# Patient Record
Sex: Female | Born: 1960 | Race: Black or African American | Hispanic: No | Marital: Married | Smoking: Former smoker
Health system: Southern US, Community
[De-identification: ages and names within clinical notes are randomized; demographics above are authoritative.]

## PROBLEM LIST (undated history)

## (undated) DIAGNOSIS — J45909 Unspecified asthma, uncomplicated: Secondary | ICD-10-CM

## (undated) DIAGNOSIS — M5010 Cervical disc disorder with radiculopathy, unspecified cervical region: Secondary | ICD-10-CM

## (undated) HISTORY — PX: ABDOMINAL HYSTERECTOMY: SHX81

## (undated) HISTORY — PX: APPENDECTOMY: SHX54

---

## 2010-12-29 ENCOUNTER — Emergency Department (HOSPITAL_BASED_OUTPATIENT_CLINIC_OR_DEPARTMENT_OTHER)
Admission: EM | Admit: 2010-12-29 | Discharge: 2010-12-30 | Disposition: A | Discharge status: Home / Self Care | Payer: Worker's Compensation | Attending: Emergency Medicine | Admitting: Emergency Medicine

## 2010-12-29 DIAGNOSIS — J45909 Unspecified asthma, uncomplicated: Secondary | ICD-10-CM | POA: Insufficient documentation

## 2010-12-29 DIAGNOSIS — M79609 Pain in unspecified limb: Secondary | ICD-10-CM | POA: Insufficient documentation

## 2015-07-17 ENCOUNTER — Emergency Department (HOSPITAL_BASED_OUTPATIENT_CLINIC_OR_DEPARTMENT_OTHER)
Admission: EM | Admit: 2015-07-17 | Discharge: 2015-07-18 | Disposition: A | Discharge status: Home / Self Care | Payer: Worker's Compensation | Attending: Emergency Medicine | Admitting: Emergency Medicine

## 2015-07-17 ENCOUNTER — Encounter (HOSPITAL_BASED_OUTPATIENT_CLINIC_OR_DEPARTMENT_OTHER): Payer: Self-pay | Admitting: Emergency Medicine

## 2015-07-17 ENCOUNTER — Emergency Department (HOSPITAL_BASED_OUTPATIENT_CLINIC_OR_DEPARTMENT_OTHER): Payer: Worker's Compensation

## 2015-07-17 DIAGNOSIS — Z87891 Personal history of nicotine dependence: Secondary | ICD-10-CM | POA: Insufficient documentation

## 2015-07-17 DIAGNOSIS — R0789 Other chest pain: Secondary | ICD-10-CM | POA: Insufficient documentation

## 2015-07-17 DIAGNOSIS — Z880 Allergy status to penicillin: Secondary | ICD-10-CM | POA: Insufficient documentation

## 2015-07-17 DIAGNOSIS — R0781 Pleurodynia: Secondary | ICD-10-CM

## 2015-07-17 DIAGNOSIS — J45909 Unspecified asthma, uncomplicated: Secondary | ICD-10-CM | POA: Insufficient documentation

## 2015-07-17 HISTORY — DX: Unspecified asthma, uncomplicated: J45.909

## 2015-07-17 NOTE — ED Notes (Signed)
Patient transported to X-ray 

## 2015-07-17 NOTE — ED Notes (Signed)
Pain and tenderness under left breast since 2am.  Worse with movement.  Sts she has been coughing for a month. More pain with the cough today.

## 2015-07-18 ENCOUNTER — Encounter (HOSPITAL_BASED_OUTPATIENT_CLINIC_OR_DEPARTMENT_OTHER): Payer: Self-pay | Admitting: Emergency Medicine

## 2015-07-18 ENCOUNTER — Emergency Department (HOSPITAL_BASED_OUTPATIENT_CLINIC_OR_DEPARTMENT_OTHER)
Admission: EM | Admit: 2015-07-18 | Discharge: 2015-07-18 | Disposition: A | Discharge status: Home / Self Care | Payer: Worker's Compensation | Attending: Emergency Medicine | Admitting: Emergency Medicine

## 2015-07-18 DIAGNOSIS — R6883 Chills (without fever): Secondary | ICD-10-CM | POA: Insufficient documentation

## 2015-07-18 DIAGNOSIS — Z880 Allergy status to penicillin: Secondary | ICD-10-CM | POA: Insufficient documentation

## 2015-07-18 DIAGNOSIS — R112 Nausea with vomiting, unspecified: Secondary | ICD-10-CM | POA: Insufficient documentation

## 2015-07-18 DIAGNOSIS — J45901 Unspecified asthma with (acute) exacerbation: Secondary | ICD-10-CM | POA: Insufficient documentation

## 2015-07-18 DIAGNOSIS — R0789 Other chest pain: Secondary | ICD-10-CM

## 2015-07-18 LAB — CBC WITH DIFFERENTIAL/PLATELET
Basophils Absolute: 0 10*3/uL (ref 0.0–0.1)
Basophils Relative: 0 %
EOS ABS: 0 10*3/uL (ref 0.0–0.7)
EOS PCT: 0 %
HCT: 40.3 % (ref 36.0–46.0)
Hemoglobin: 12.5 g/dL (ref 12.0–15.0)
LYMPHS ABS: 1.7 10*3/uL (ref 0.7–4.0)
LYMPHS PCT: 18 %
MCH: 22.8 pg — AB (ref 26.0–34.0)
MCHC: 31 g/dL (ref 30.0–36.0)
MCV: 73.4 fL — AB (ref 78.0–100.0)
MONO ABS: 0.3 10*3/uL (ref 0.1–1.0)
Monocytes Relative: 3 %
Neutro Abs: 7 10*3/uL (ref 1.7–7.7)
Neutrophils Relative %: 79 %
PLATELETS: 333 10*3/uL (ref 150–400)
RBC: 5.49 MIL/uL — ABNORMAL HIGH (ref 3.87–5.11)
RDW: 15.7 % — AB (ref 11.5–15.5)
WBC: 9 10*3/uL (ref 4.0–10.5)

## 2015-07-18 LAB — D-DIMER, QUANTITATIVE: D-Dimer, Quant: 0.28 ug/mL-FEU (ref 0.00–0.50)

## 2015-07-18 LAB — BRAIN NATRIURETIC PEPTIDE: B NATRIURETIC PEPTIDE 5: 35.2 pg/mL (ref 0.0–100.0)

## 2015-07-18 MED ORDER — METHOCARBAMOL 500 MG PO TABS: 1000.0000 mg | ORAL_TABLET | Freq: Three times a day (TID) | ORAL | Status: DC | PRN

## 2015-07-18 MED ORDER — ONDANSETRON 4 MG PO TBDP
4.0000 mg | ORAL_TABLET | Freq: Once | ORAL | Status: AC | PRN
  Administered 2015-07-18: 4 mg via ORAL
  Filled 2015-07-18: qty 1

## 2015-07-18 MED ORDER — KETOROLAC TROMETHAMINE 30 MG/ML IJ SOLN
30.0000 mg | Freq: Once | INTRAMUSCULAR | Status: AC
  Administered 2015-07-18: 30 mg via INTRAVENOUS
  Filled 2015-07-18: qty 1

## 2015-07-18 MED ORDER — HYDROCODONE-ACETAMINOPHEN 5-325 MG PO TABS
1.0000 | ORAL_TABLET | Freq: Once | ORAL | Status: AC
  Administered 2015-07-18: 1 via ORAL
  Filled 2015-07-18: qty 1

## 2015-07-18 MED ORDER — IBUPROFEN 600 MG PO TABS: 600.0000 mg | ORAL_TABLET | Freq: Four times a day (QID) | ORAL | Status: DC | PRN

## 2015-07-18 MED ORDER — HYDROCODONE-ACETAMINOPHEN 5-325 MG PO TABS: 1.0000 | ORAL_TABLET | Freq: Four times a day (QID) | ORAL | Status: DC | PRN

## 2015-07-18 MED ORDER — TRAMADOL HCL 50 MG PO TABS: 50.0000 mg | ORAL_TABLET | Freq: Four times a day (QID) | ORAL | Status: DC | PRN

## 2015-07-18 NOTE — ED Notes (Signed)
Patient was here earlier for Pain and tenderness under left breast since 2am. The patient states that she is back because it is now up under her rib. Patient states that she is now nauseated and throwing up with cold chills

## 2015-07-18 NOTE — ED Provider Notes (Signed)
CSN: [id number]     Arrival date & time 07/18/15  1905 History  By signing my name below, I, Stephanie Adams, attest that this documentation has been prepared under the direction and in the presence of Loren Racer, MD. Electronically Signed: Budd Adams, ED Scribe. 07/18/2015. 9:01 PM.    Chief Complaint  Patient presents with  . Breast Pain   The history is provided by the patient and the spouse. No language interpreter was used.   HPI Comments: Stephanie Adams is a 55 y.o. female former smoker with a PMHx of asthma who presents to the Emergency Department complaining of constant, sharp pain under the left breast onset yesterday morning at around 3 AM. Pt states she was seen in the ED that morning, at which time she was prescribed Vicodin. She has returned due to the medication causing her n/v and chills, as well as the pain now having moved underneath the rib. She reports associated SOB and rhinorrhea. Per husband, pt has a dry cough starting yesterday. Pt denies swelling and pain in the legs, recent travel, and sore throat.   Past Medical History  Diagnosis Date  . Asthma    Past Surgical History  Procedure Laterality Date  . Appendectomy    . Abdominal hysterectomy    . Cesarean section     History reviewed. No pertinent family history. Social History  Substance Use Topics  . Smoking status: Former Games developer  . Smokeless tobacco: None  . Alcohol Use: No   OB History    No data available     Review of Systems  Constitutional: Positive for chills. Negative for fever.  HENT: Negative for sore throat.   Respiratory: Positive for cough. Negative for shortness of breath and wheezing.   Cardiovascular: Positive for chest pain. Negative for leg swelling.  Gastrointestinal: Positive for nausea and vomiting. Negative for abdominal pain.  Musculoskeletal: Negative for myalgias, back pain and neck pain.  Skin: Negative for rash and wound.  Neurological: Negative for dizziness,  weakness, light-headedness, numbness and headaches.  All other systems reviewed and are negative.   Allergies  Penicillins  Home Medications   Prior to Admission medications   Medication Sig Start Date End Date Taking? Authorizing Provider  HYDROcodone-acetaminophen (NORCO/VICODIN) 5-325 MG tablet Take 1-2 tablets by mouth every 6 (six) hours as needed (for rib pain). 07/18/15  Yes John Molpus, MD  ibuprofen (ADVIL,MOTRIN) 600 MG tablet Take 1 tablet (600 mg total) by mouth every 6 (six) hours as needed. 07/18/15   Loren Racer, MD  methocarbamol (ROBAXIN) 500 MG tablet Take 2 tablets (1,000 mg total) by mouth every 8 (eight) hours as needed for muscle spasms. 07/18/15   Loren Racer, MD  traMADol (ULTRAM) 50 MG tablet Take 1 tablet (50 mg total) by mouth every 6 (six) hours as needed. 07/18/15   Loren Racer, MD   BP 98/63 mmHg  Pulse 67  Temp(Src) 98.2 F (36.8 C) (Oral)  Resp 14  Ht  (1.499 m)  Wt 135 lb (61.236 kg)  BMI 27.25 kg/m2  SpO2 97% Physical Exam  Constitutional: She is oriented to person, place, and time. She appears well-developed and well-nourished. No distress.  HENT:  Head: Normocephalic and atraumatic.  Mouth/Throat: Oropharynx is clear and moist.  Eyes: EOM are normal. Pupils are equal, round, and reactive to light.  Neck: Normal range of motion. Neck supple.  Cardiovascular: Normal rate and regular rhythm.   Pulmonary/Chest: Effort normal and breath sounds normal. No respiratory distress.  She has no wheezes. She has no rales. She exhibits tenderness (exquisite tenderness under the left breast along the inferior chest wall. There is no crepitance or deformity.).  Abdominal: Soft. Bowel sounds are normal. She exhibits no distension and no mass. There is no tenderness. There is no rebound and no guarding.  Musculoskeletal: Normal range of motion. She exhibits no edema or tenderness.  No midline thoracic or lumbar tenderness. No lower extremity swelling  or pain. Distal pulses intact.  Neurological: She is alert and oriented to person, place, and time.  Moves all extremities without deficit. Sensation is fully intact.  Skin: Skin is warm and dry. No rash noted. No erythema.  Psychiatric: She has a normal mood and affect. Her behavior is normal.  Nursing note and vitals reviewed.   ED Course  Procedures  DIAGNOSTIC STUDIES: Oxygen Saturation is 100% on RA, normal by my interpretation.    COORDINATION OF CARE: 8:56 PM - Discussed plans to order diagnostic studies and a different medication for pain. Pt advised of plan for treatment and pt agrees.  Labs Review Labs Reviewed  CBC WITH DIFFERENTIAL/PLATELET - Abnormal; Notable for the following:    RBC 5.49 (*)    MCV 73.4 (*)    MCH 22.8 (*)    RDW 15.7 (*)    All other components within normal limits  BRAIN NATRIURETIC PEPTIDE  D-DIMER, QUANTITATIVE (NOT AT [facility])    Imaging Review Dg Chest 2 View  07/17/2015  CLINICAL DATA:  Left-sided chest pain for 1 day, cough for 2 months EXAM: CHEST  2 VIEW COMPARISON:  None. FINDINGS: Heart size upper normal. Vascular pattern is normal. Lungs are clear. No pleural effusions. Bony thorax intact. IMPRESSION: No acute findings Electronically Signed   By: Esperanza Heir M.D.   On: 07/17/2015 23:12   I have personally reviewed and evaluated these images and lab results as part of my medical decision-making.   EKG Interpretation None      MDM   Final diagnoses:  Left-sided chest wall pain    I personally performed the services described in this documentation, which was scribed in my presence. The recorded information has been reviewed and is accurate.   Normal white blood cell count. Normal d-dimer. Do not believe that repeat chest x-ray is indicated. Patient with likely muscle strain versus nondisplaced fracture. We'll treat with anti-inflammatory and muscle relaxant.  Loren Racer, MD 07/18/15 2208

## 2015-07-18 NOTE — ED Provider Notes (Signed)
CSN: [id number]     Arrival date & time 07/17/15  2244 History  By signing my name below, I, Bethel Born, attest that this documentation has been prepared under the direction and in the presence of Paula Libra, MD. Electronically Signed: Bethel Born, ED Scribe. 07/18/2015. 12:13 AM   Chief Complaint  Patient presents with  . Chest Wall Pain    The history is provided by the patient. No language interpreter was used.   Stephanie Adams is a 55 y.o. female with PMHx of asthma  who presents to the Emergency Department complaining of constant, 7/10 in severity, sharp, pain under the left breast with onset approximately 22 hours ago. The pain is elicited with deep breathing, cough and movement. Pt is unable to recall any trauma preceding the pain. Associated symptoms include SOB and cough. Pt denies fever.   Past Medical History  Diagnosis Date  . Asthma    Past Surgical History  Procedure Laterality Date  . Appendectomy    . Abdominal hysterectomy    . Cesarean section     No family history on file. Social History  Substance Use Topics  . Smoking status: Former Games developer  . Smokeless tobacco: None  . Alcohol Use: No   OB History    No data available     Review of Systems 10 Systems reviewed and all are negative for acute change except as noted in the HPI.  Allergies  Penicillins  Home Medications   Prior to Admission medications   Not on File   BP 142/52 mmHg  Pulse 68  Temp(Src) 98.3 F (36.8 C) (Oral)  Resp 18  Ht  (1.499 m)  Wt 135 lb (61.236 kg)  BMI 27.25 kg/m2  SpO2 100% Physical Exam General: Well-developed, well-nourished female in no acute distress; appearance consistent with age of record HENT: normocephalic; atraumatic Eyes: pupils equal, round and reactive to light; extraocular muscles intact Neck: supple Heart: regular rate and rhythm Lungs: clear to auscultation bilaterally Chest: Left lower rib point tenderness without deformity or  crepitus Abdomen: soft; nondistended; nontender; no masses or hepatosplenomegaly; bowel sounds present Extremities: No deformity; full range of motion Neurologic: Awake, alert and oriented; motor function intact in all extremities and symmetric; no facial droop Skin: Warm and dry Psychiatric: Normal mood and affect  ED Course  Procedures (including critical care time) DIAGNOSTIC STUDIES: Oxygen Saturation is 100% on RA,  normal by my interpretation.    COORDINATION OF CARE: 12:11 AM Discussed treatment plan which includes CXR with pt at bedside and pt agreed to plan.   MDM    Final diagnoses:  Rib pain on left side   Nursing notes and vitals signs, including pulse oximetry, reviewed.  Summary of this visit's results, reviewed by myself:  Labs:  No results found for this or any previous visit (from the past 24 hour(s)).  Imaging Studies: Dg Chest 2 View  07/17/2015  CLINICAL DATA:  Left-sided chest pain for 1 day, cough for 2 months EXAM: CHEST  2 VIEW COMPARISON:  None. FINDINGS: Heart size upper normal. Vascular pattern is normal. Lungs are clear. No pleural effusions. Bony thorax intact. IMPRESSION: No acute findings Electronically Signed   By: Esperanza Heir M.D.   On: 07/17/2015 23:12      Paula Libra, MD 07/18/15 [phone]

## 2015-07-18 NOTE — ED Notes (Signed)
L antero/axillary rib pain, onset with coughing at ~2300 when she got up to go the b/r tonight. Also sob and cough. (denies: productive cough, fever, nvd or dizziness). resps shallow and guarded.

## 2015-07-18 NOTE — ED Notes (Signed)
Pt d/c home w/all belongings, pt a/o x4, 1 new RX prescribed. Pt driven home by spouse

## 2015-07-18 NOTE — Discharge Instructions (Signed)

## 2015-11-01 ENCOUNTER — Emergency Department (HOSPITAL_BASED_OUTPATIENT_CLINIC_OR_DEPARTMENT_OTHER)
Admission: EM | Admit: 2015-11-01 | Discharge: 2015-11-01 | Disposition: A | Discharge status: Home / Self Care | Payer: Worker's Compensation | Attending: Emergency Medicine | Admitting: Emergency Medicine

## 2015-11-01 ENCOUNTER — Encounter (HOSPITAL_BASED_OUTPATIENT_CLINIC_OR_DEPARTMENT_OTHER): Payer: Self-pay | Admitting: *Deleted

## 2015-11-01 DIAGNOSIS — Z87891 Personal history of nicotine dependence: Secondary | ICD-10-CM | POA: Insufficient documentation

## 2015-11-01 DIAGNOSIS — J45909 Unspecified asthma, uncomplicated: Secondary | ICD-10-CM | POA: Insufficient documentation

## 2015-11-01 DIAGNOSIS — M79602 Pain in left arm: Secondary | ICD-10-CM | POA: Insufficient documentation

## 2015-11-01 MED ORDER — NAPROXEN 250 MG PO TABS: 500.0000 mg | ORAL_TABLET | Freq: Once | ORAL | Status: DC

## 2015-11-01 MED ORDER — HYDROCODONE-ACETAMINOPHEN 5-325 MG PO TABS: 1.0000 | ORAL_TABLET | Freq: Four times a day (QID) | ORAL | Status: DC | PRN

## 2015-11-01 MED ORDER — NAPROXEN 500 MG PO TABS: ORAL_TABLET | ORAL | Status: DC

## 2015-11-01 NOTE — Discharge Instructions (Signed)

## 2015-11-01 NOTE — ED Provider Notes (Signed)
CSN: [id number]     Arrival date & time 11/01/15  2036 History  By signing my name below, I, Linus Galas, attest that this documentation has been prepared under the direction and in the presence of Paula Libra, MD. Electronically Signed: Linus Galas, ED Scribe. 11/01/2015. 11:31 PM.    Chief Complaint  Patient presents with  . Arm Pain   The history is provided by the patient. No language interpreter was used.   HPI Comments: Stephanie Adams is a 55 y.o. female who presents to the Emergency Department with no pertinent PMHx complaining of sharp arm pain that began 3 days ago. Pt states she feels pain from her left shoulder to her left arm into "all five fingers." She reports relief Grasping her left wrist or when placing her left arm above her head. Symptoms are moderate. She denies any injuries. Pt denies chest pain or shortness of breath.    Past Medical History  Diagnosis Date  . Asthma    Past Surgical History  Procedure Laterality Date  . Appendectomy    . Abdominal hysterectomy    . Cesarean section     No family history on file. Social History  Substance Use Topics  . Smoking status: Former Games developer  . Smokeless tobacco: None  . Alcohol Use: No   OB History    No data available     Review of Systems  A complete 10 system review of systems was obtained and all systems are negative except as noted in the HPI and PMH.   Allergies  Penicillins  Home Medications   Prior to Admission medications   Medication Sig Start Date End Date Taking? Authorizing Provider  HYDROcodone-acetaminophen (NORCO) 5-325 MG tablet Take 1-2 tablets by mouth every 6 (six) hours as needed for severe pain. 11/01/15   Albie Bazin, MD  naproxen (NAPROSYN) 500 MG tablet Take one tablet with food twice daily as needed for arm pain. 11/01/15   Arionne Iams, MD   BP 148/87 mmHg  Pulse 60  Temp(Src) 98.1 F (36.7 C) (Oral)  Resp 16  Ht  (1.499 m)  Wt 125 lb (56.7 kg)  BMI 25.23 kg/m2  SpO2 100%    Physical Exam General: Well-developed, well-nourished female in no acute distress; appearance consistent with age of record HENT: normocephalic; atraumatic Eyes: pupils equal, round and reactive to light; extraocular muscles intact Neck: supple Heart: regular rate and rhythm; no murmurs, rubs or gallops Lungs: clear to auscultation bilaterally Abdomen: soft; nondistended; nontender; no masses or hepatosplenomegaly; bowel sounds present Extremities: No deformity; full range of motion; pulses normal; left shoulder and arm nontender but pain relieved when raising her left arm up behind her head. Neurologic: Awake, alert and oriented; motor function intact in all extremities and symmetric; no facial droop; sensation intact in upper extremities and symmetri ;Skin: Warm and dry Psychiatric: Normal mood and affect   ED Course  Procedures  DIAGNOSTIC STUDIES: Oxygen Saturation is 100% on room air, normal by my interpretation.    COORDINATION OF CARE: 11:30 PM Discussed treatment plan with pt at bedside and pt agreed to plan.  MDM  No results found. No results found for this or any previous visit (from the past 24 hour(s)).  EKG Interpretation  Date/Time:  Monday Nov 01 2015 21:01:01 EDT Ventricular Rate:  63 PR Interval:  158 QRS Duration: 78 QT Interval:  408 QTC Calculation: 417 R Axis:   70 Text Interpretation:  Normal sinus rhythm with sinus arrhythmia Normal  ECG Confirmed by DELO  MD, DOUGLAS ([id number]) on 11/01/2015 11:14:56 PM      The pain is not dermatomal which would be expected in a case of cervical radiculopathy. The pain does appear to be musculoskeletal in nature and we will refer to sports medicine.  Final diagnoses:  Pain In Left Arm   I personally performed the services described in this documentation, which was scribed in my presence. The recorded information has been reviewed and is accurate.   Paula LibraJohn Kemuel Buchmann, MD 11/01/15 [phone]

## 2015-11-01 NOTE — ED Notes (Signed)
Pain in her left shoulder and tingling in her fingers x 3 days. Denies chest pain.

## 2015-11-01 NOTE — ED Notes (Signed)
States pain is about the same. She gets some relief when she moves or puts her arm above her head.

## 2015-11-12 ENCOUNTER — Ambulatory Visit: Payer: Self-pay | Admitting: Family Medicine

## 2015-11-15 ENCOUNTER — Encounter: Payer: Self-pay | Admitting: Family Medicine

## 2015-11-15 ENCOUNTER — Ambulatory Visit (INDEPENDENT_AMBULATORY_CARE_PROVIDER_SITE_OTHER): Payer: Self-pay | Admitting: Family Medicine

## 2015-11-15 VITALS — BP 112/78 | HR 82 | Ht 59.0 in | Wt 135.0 lb

## 2015-11-15 DIAGNOSIS — M5010 Cervical disc disorder with radiculopathy, unspecified cervical region: Secondary | ICD-10-CM

## 2015-11-15 MED ORDER — PREDNISONE 10 MG PO TABS: ORAL_TABLET | ORAL | Status: DC

## 2015-11-15 MED ORDER — HYDROCODONE-ACETAMINOPHEN 5-325 MG PO TABS: 1.0000 | ORAL_TABLET | Freq: Four times a day (QID) | ORAL | Status: DC | PRN

## 2015-11-15 NOTE — Patient Instructions (Signed)
You have cervical radiculopathy (a pinched nerve in the neck). Prednisone 6 day dose pack to relieve irritation/inflammation of the nerve. Day AFTER finishing the prednisone it's ok to restart ibuprofen 600mg  three times a day with food with food for pain and inflammation. Norco as needed for severe pain (no driving on this medicine). Consider cervical collar if severely painful. Simple range of motion exercises within limits of pain to prevent further stiffness. Consider physical therapy for stretching, exercises, traction, and modalities in the future. Heat 15 minutes at a time 3-4 times a day to help with spasms. Watch head position when on computers, texting, when sleeping in bed - should in line with back to prevent further nerve traction and irritation. If not improving we will consider an MRI. Call me in 1-2 weeks to let me know how you're doing.

## 2015-11-17 DIAGNOSIS — M5010 Cervical disc disorder with radiculopathy, unspecified cervical region: Secondary | ICD-10-CM | POA: Insufficient documentation

## 2015-11-17 NOTE — Assessment & Plan Note (Signed)
Cervical radiculopathy - classic presentation likely from a disc bulge lower cervical region.  Start with prednisone dose pack then restart ibuprofen.  Norco as needed for severe pain.  Motion exercises, heat.  Ergonomic issues discussed.  Call us in 1-2 weeks for an update on her status.  Add PT if improving, MRI if not improving.

## 2015-11-17 NOTE — Progress Notes (Signed)
PCP: No PCP Per Patient  Subjective:   HPI: Patient is a 55 y.o. female here for left shoulder/neck pain.  Patient reports since 5/8 she's had posterior and lateral left shoulder pain. Pain radiates down arm with numbness into 4th and 5th digits. Numbness, pain improved with putting arm overhead. Pain level 8/10, sharp. Worse with cooking, picking up items. No prior issues. Taking naproxen and hydrocodone. No skin changes, recent illness.  Past Medical History  Diagnosis Date  . Asthma     No current outpatient prescriptions on file prior to visit.   No current facility-administered medications on file prior to visit.    Past Surgical History  Procedure Laterality Date  . Appendectomy    . Abdominal hysterectomy    . Cesarean section      Allergies  Allergen Reactions  . Penicillins Swelling    Social History   Social History  . Marital Status: Married    Spouse Name: N/A  . Number of Children: N/A  . Years of Education: N/A   Occupational History  . Not on file.   Social History Main Topics  . Smoking status: Former Games developermoker  . Smokeless tobacco: Not on file  . Alcohol Use: No  . Drug Use: No  . Sexual Activity: Yes    Birth Control/ Protection: Surgical   Other Topics Concern  . Not on file   Social History Narrative    No family history on file.  BP 112/78 mmHg  Pulse 82  Ht 4\' 11"  (1.499 m)  Wt 135 lb (61.236 kg)  BMI 27.25 kg/m2  Review of Systems: See HPI above.    Objective:  Physical Exam:  Gen: NAD, comfortable in exam room  Neck: No gross deformity, swelling, bruising. TTP mildly left cervical paraspinal region, trapezius.  No midline/bony TTP. FROM neck - pain with left lateral rotation, less with extension. BUE strength 5/5.   Sensation diminished 5th digit on left only. 2+ equal reflexes in triceps, biceps, brachioradialis tendons. Negative spurlings.  Left shoulder: No swelling, ecchymoses.  No gross deformity. No  TTP. FROM. Negative Hawkins, Neers. Negative Yergasons. Strength 5/5 with empty can and resisted internal/external rotation. Negative apprehension. NV intact distally.    Assessment & Plan:  1. Cervical radiculopathy - classic presentation likely from a disc bulge lower cervical region.  Start with prednisone dose pack then restart ibuprofen.  Norco as needed for severe pain.  Motion exercises, heat.  Ergonomic issues discussed.  Call us in 1-2 weeks for an update on her status.  Add PT if improving, MRI if not improving.

## 2016-08-29 ENCOUNTER — Emergency Department (HOSPITAL_BASED_OUTPATIENT_CLINIC_OR_DEPARTMENT_OTHER)
Admission: EM | Admit: 2016-08-29 | Discharge: 2016-08-29 | Disposition: A | Discharge status: Home / Self Care | Payer: BLUE CROSS/BLUE SHIELD | Attending: Emergency Medicine | Admitting: Emergency Medicine

## 2016-08-29 ENCOUNTER — Encounter (HOSPITAL_BASED_OUTPATIENT_CLINIC_OR_DEPARTMENT_OTHER): Payer: Self-pay | Admitting: Emergency Medicine

## 2016-08-29 ENCOUNTER — Emergency Department (HOSPITAL_BASED_OUTPATIENT_CLINIC_OR_DEPARTMENT_OTHER): Payer: BLUE CROSS/BLUE SHIELD

## 2016-08-29 DIAGNOSIS — Z79899 Other long term (current) drug therapy: Secondary | ICD-10-CM | POA: Insufficient documentation

## 2016-08-29 DIAGNOSIS — J45909 Unspecified asthma, uncomplicated: Secondary | ICD-10-CM | POA: Insufficient documentation

## 2016-08-29 DIAGNOSIS — Z87891 Personal history of nicotine dependence: Secondary | ICD-10-CM | POA: Insufficient documentation

## 2016-08-29 DIAGNOSIS — M25512 Pain in left shoulder: Secondary | ICD-10-CM

## 2016-08-29 HISTORY — DX: Cervical disc disorder with radiculopathy, unspecified cervical region: M50.10

## 2016-08-29 MED ORDER — HYDROCODONE-ACETAMINOPHEN 5-325 MG PO TABS
1.0000 | ORAL_TABLET | Freq: Once | ORAL | Status: AC
Start: 2016-08-29 — End: 2016-08-29
  Administered 2016-08-29: 1 via ORAL
  Filled 2016-08-29: qty 1

## 2016-08-29 MED ORDER — PREDNISONE 10 MG PO TABS: ORAL_TABLET | ORAL | 0 refills | Status: AC

## 2016-08-29 NOTE — ED Provider Notes (Signed)
[facility] Provider Note   CSN: [id number] Arrival date & time: 08/29/16  1946  By signing my name below, I, Stephanie Adams, attest that this documentation has been prepared under the direction and in the presence of Demetrios Loll, PA-C.  Electronically Signed: Cynda Adams, Scribe. 08/29/16. 8:33 PM.  History   Chief Complaint Chief Complaint  Patient presents with  . Shoulder Pain    HPI Comments: Stephanie Adams is a 56 y.o. female who presents to the Emergency Department complaining of sudden-onset, intermittent left shoulder blade pain that began 3 weeks ago. 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  Patient reports associated pain radiation down the left arm. Patient reports taking ibuprofen 500 mg with minimal relief. Patient was here 6 months ago for a similar problem, in which she was referred to Dr. Mare Loan. He was concerned for cervical radiculopathy, but the patient never followed up with him. She was prescribed prednisone, which she states did improve her pain. Patient states the pain is worse when lifting the arm up, better when raising the arm over her head. Patient denies any recent trauma,  heavy lifting, numbness/tingling, weakness, fever, or any other symptoms.   The history is provided by the patient. No language interpreter was used.  Shoulder Pain   Episode onset: 3 weeks ago. The problem occurs constantly. The problem has been gradually worsening. The pain is present in the left arm and left shoulder. The quality of the pain is described as intermittent. The pain is moderate. Pertinent negatives include no numbness and no tingling. She has tried OTC pain medications for the symptoms. The treatment provided no relief. There has been no history of extremity trauma. Family history is significant for no rheumatoid arthritis and no gout.    Past Medical History:  Diagnosis Date  . Asthma   . Cervical disc disorder with  radiculopathy of cervical region     Patient Active Problem List   Diagnosis Date Noted  . Cervical disc disorder with radiculopathy of cervical region 11/17/2015    Past Surgical History:  Procedure Laterality Date  . ABDOMINAL HYSTERECTOMY    . APPENDECTOMY    . CESAREAN SECTION      OB History    No data available       Home Medications    Prior to Admission medications   Medication Sig Start Date End Date Taking? Authorizing Provider  ibuprofen (ADVIL,MOTRIN) 400 MG tablet Take 400 mg by mouth every 6 (six) hours as needed.   Yes Historical Provider, MD  albuterol (PROVENTIL HFA;VENTOLIN HFA) 108 (90 Base) MCG/ACT inhaler Inhale into the lungs.    Historical Provider, MD  HYDROcodone-acetaminophen (NORCO) 5-325 MG tablet Take 1 tablet by mouth every 6 (six) hours as needed for severe pain. 11/15/15   Lenda Kelp, MD  predniSONE (DELTASONE) 10 MG tablet 6 tabs po day 1, 5 tabs po day 2, 4 tabs po day 3, 3 tabs po day 4, 2 tabs po day 5, 1 tab po day 6 11/15/15   Lenda Kelp, MD    Family History No family history on file.  Social History Social History  Substance Use Topics  . Smoking status: Former Games developer  . Smokeless tobacco: Never Used  . Alcohol use No     Allergies   Penicillins   Review of Systems Review of Systems  Constitutional: Negative for fever.  Gastrointestinal: Negative for nausea and vomiting.  Musculoskeletal: Positive for arthralgias (left shoulder, left arm ).  Neurological: Negative for tingling, weakness and numbness.  All other systems reviewed and are negative.    Physical Exam Updated Vital Signs BP 147/84 (BP Location: Right Arm)   Pulse 74   Temp 98.2 F (36.8 C) (Oral)   Resp 20   Ht 4\' 10"  (1.473 m)   Wt 135 lb (61.2 kg)   SpO2 100%   BMI 28.22 kg/m   Physical Exam  Constitutional: She is oriented to person, place, and time. She appears well-developed and well-nourished. No distress.  HENT:  Head:  Normocephalic and atraumatic.  Mouth/Throat: Oropharynx is clear and moist.  Eyes: Conjunctivae and EOM are normal. Pupils are equal, round, and reactive to light.  Neck: Normal range of motion. Neck supple.  No midline tenderness. No deformity or step-offs noted. Full range of motion.  Cardiovascular: Normal rate.   Pulmonary/Chest: Effort normal.  Abdominal: Soft. Bowel sounds are normal.  Musculoskeletal: Normal range of motion.  Mild tenderness to palpation of the left shoulder joint. Pain seems to follow a dermatomal pattern. Patient holding her arm over her head as it gives her relief in pain. Radial pulses are 2+ bilaterally. Cap refill normal. Full range of motion of left shoulder and left arm.  Lymphadenopathy:    She has no cervical adenopathy.  Neurological: She is alert and oriented to person, place, and time.  Grip strength is equal bilaterally in upper extremities. Sensation is intact to sharp/dull all dermatomal patterns in upper extremities. Cranial nerves II through XII grossly intact. Rapid alternating movements including finger to nose normal.  Skin: Skin is warm and dry. Capillary refill takes less than 2 seconds.  Psychiatric: She has a normal mood and affect.  Nursing note and vitals reviewed.    ED Treatments / Results  DIAGNOSTIC STUDIES: Oxygen Saturation is 100% on RA, normal by my interpretation.    COORDINATION OF CARE: 8:33 PM Discussed treatment plan with pt at bedside and pt agreed to plan, which includes imaging and pain medication.   Labs (all labs ordered are listed, but only abnormal results are displayed) Labs Reviewed - No data to display  EKG  EKG Interpretation None       Radiology No results found.  Procedures Procedures (including critical care time)  Medications Ordered in ED Medications  HYDROcodone-acetaminophen (NORCO/VICODIN) 5-325 MG per tablet 1 tablet (1 tablet Oral Given 08/29/16 2037)     Initial Impression /  Assessment and Plan / ED Course  I have reviewed the triage vital signs and the nursing notes.  Pertinent labs & imaging results that were available during my care of the patient were reviewed by me and considered in my medical decision making (see chart for details).     Patient presents to the ED with left shoulder pain that radiates down left arm. She is neurovascularly intact. Full range of motion and normal strength. Pain seems to follow a dermatomal pattern likely from a cervical radiculopathy. Patient was seen 6 months ago in ED for same. She'll follow up with Dr. Pearletha Forge. He prescribed her prednisone and PT. She was supposed to follow-up with him in the pain did not improve never followed up. She'll likely need an MRI. Have discussed with patient that she is a followed back up with him for an MRI. We'll give her a short dose of prednisone taper. X-rays are unremarkable. Encouraged the use of Tylenol and ibuprofen. I have given her strict return precautions.  Discussed follow-up. Of course rancher prior to discharge.  Final Clinical Impressions(s) / ED Diagnoses   Final diagnoses:  Acute pain of left shoulder    New Prescriptions Discharge Medication List as of 08/29/2016  9:35 PM     I personally performed the services described in this documentation, which was scribed in my presence. The recorded information has been reviewed and is accurate.]    Rise MuKenneth T Faduma Cho, PA-C 09/01/16 0740    Tilden FossaElizabeth Rees, MD 09/02/16 1431

## 2016-08-29 NOTE — Discharge Instructions (Signed)
This is likely a radicular pain caused by a pinched nerve. Please take the steroids as prescribed. Avoid NSAIDs including Motrin and ibuprofen until finished with the NSAIDs. May take Tylenol. He called Dr. Pearletha ForgeHudnall tomorrow for follow up. Return to the ED if your symptoms worsen.

## 2016-08-29 NOTE — ED Triage Notes (Addendum)
Pt c/o left shoulder pain radiating down left arm. Pt states she has had this pain intermittently x 3 weeks. Pt reports pain is worse in certain positions.

## 2016-09-01 ENCOUNTER — Ambulatory Visit (INDEPENDENT_AMBULATORY_CARE_PROVIDER_SITE_OTHER): Payer: BLUE CROSS/BLUE SHIELD | Admitting: Family Medicine

## 2016-09-01 ENCOUNTER — Encounter: Payer: Self-pay | Admitting: Family Medicine

## 2016-09-01 DIAGNOSIS — M5010 Cervical disc disorder with radiculopathy, unspecified cervical region: Secondary | ICD-10-CM | POA: Diagnosis not present

## 2016-09-01 MED ORDER — METHOCARBAMOL 500 MG PO TABS: 500.0000 mg | ORAL_TABLET | Freq: Three times a day (TID) | ORAL | 1 refills | Status: AC | PRN

## 2016-09-01 MED ORDER — HYDROCODONE-ACETAMINOPHEN 5-325 MG PO TABS: 1.0000 | ORAL_TABLET | ORAL | 0 refills | Status: AC | PRN

## 2016-09-01 NOTE — Patient Instructions (Signed)
You have cervical radiculopathy (a pinched nerve in the neck). Finish the prednisone. Aleve 2 tabs twice a day with food for pain and inflammation - start day AFTER finishing prednisone. Robaxin three times a day as needed for muscle spasms (can make you sleepy - if so do not drive while taking this). Norco as needed for severe pain (no driving on this medicine). We will go ahead with an MRI as you're not improving. Consider cervical collar if severely painful. Simple range of motion exercises within limits of pain to prevent further stiffness. Consider physical therapy for stretching, exercises, traction, and modalities. Heat 15 minutes at a time 3-4 times a day to help with spasms. Watch head position when on computers, texting, when sleeping in bed - should in line with back to prevent further nerve traction and irritation. Consider home traction unit if you get benefit with this in physical therapy. Follow up and next steps will depend on the MRI.

## 2016-09-06 NOTE — Assessment & Plan Note (Signed)
classic presentation likely from a disc bulge lower cervical region.  Not improved following visit last year with recent worsening.  Continue prednisone with robaxin and norco as needed.  Will go ahead with MRI as not improving and consider ESIs, PT, neurosurgery referral.

## 2016-09-06 NOTE — Progress Notes (Signed)
PCP: [facility]  Subjective:   HPI: Patient is a 56 y.o. female here for left shoulder/neck pain.  11/15/15: Patient reports since 5/8 she's had posterior and lateral left shoulder pain. Pain radiates down arm with numbness into 4th and 5th digits. Numbness, pain improved with putting arm overhead. Pain level 8/10, sharp. Worse with cooking, picking up items. No prior issues. Taking naproxen and hydrocodone. No skin changes, recent illness.  09/01/16: Patient reports she's continued to have problems with her neck - pain posterior left shoulder area radiating down into left hand, palm. Pain level 9/10 and sharp. Associated numbness and tingling in same distribution. Unable to sleep at night. On prednisone from ED without much change so far. Using heat. Right handed. No skin changes, numbness. Nothing seems to make this better. No bowel/bladder dysfunction.  Past Medical History:  Diagnosis Date  . Asthma   . Cervical disc disorder with radiculopathy of cervical region     Current Outpatient Prescriptions on File Prior to Visit  Medication Sig Dispense Refill  . albuterol (PROVENTIL HFA;VENTOLIN HFA) 108 (90 Base) MCG/ACT inhaler Inhale into the lungs.    Marland Kitchen ibuprofen (ADVIL,MOTRIN) 400 MG tablet Take 400 mg by mouth every 6 (six) hours as needed.    . predniSONE (DELTASONE) 10 MG tablet 6 tabs po day 1, 5 tabs po day 2, 4 tabs po day 3, 3 tabs po day 4, 2 tabs po day 5, 1 tab po day 6 21 tablet 0   No current facility-administered medications on file prior to visit.     Past Surgical History:  Procedure Laterality Date  . ABDOMINAL HYSTERECTOMY    . APPENDECTOMY    . CESAREAN SECTION      Allergies  Allergen Reactions  . Penicillins Swelling    Social History   Social History  . Marital status: Married    Spouse name: N/A  . Number of children: N/A  . Years of education: N/A   Occupational History  . Not on file.   Social History Main Topics   . Smoking status: Former Games developer  . Smokeless tobacco: Never Used  . Alcohol use No  . Drug use: No  . Sexual activity: Yes    Birth control/ protection: Surgical   Other Topics Concern  . Not on file   Social History Narrative  . No narrative on file    No family history on file.  BP 115/69   Pulse 76   Ht 4\' 11"  (1.499 m)   Wt 135 lb (61.2 kg)   BMI 27.27 kg/m   Review of Systems: See HPI above.    Objective:  Physical Exam:  Gen: NAD, comfortable in exam room  Neck: No gross deformity, swelling, bruising. TTP mildly left cervical paraspinal region, trapezius.  No midline/bony TTP. FROM neck - pain with left lateral rotation. Strength left finger abduction, elbow flexion and extension 4/5 - 5/5 other muscle groups bilateral upper extremities. Sensation diminished left palm. 2+ equal reflexes in triceps, biceps, brachioradialis tendons. Negative spurlings.  Left shoulder: No swelling, ecchymoses.  No gross deformity. No TTP. FROM. Negative Hawkins, Neers. Negative Yergasons. Strength 5/5 with empty can and resisted internal/external rotation. Negative apprehension. NV intact distally.    Assessment & Plan:  1. Cervical radiculopathy - classic presentation likely from a disc bulge lower cervical region.  Not improved following visit last year with recent worsening.  Continue prednisone with robaxin and norco as needed.  Will go ahead with MRI  as not improving and consider ESIs, PT, neurosurgery referral.

## 2016-09-08 NOTE — Addendum Note (Signed)
Addended by: Kathi SimpersWISE, Annai Heick F on: 09/08/2016 12:39 PM   Modules accepted: Orders

## 2016-09-09 ENCOUNTER — Ambulatory Visit (HOSPITAL_BASED_OUTPATIENT_CLINIC_OR_DEPARTMENT_OTHER): Payer: BLUE CROSS/BLUE SHIELD

## 2017-09-04 ENCOUNTER — Emergency Department (HOSPITAL_BASED_OUTPATIENT_CLINIC_OR_DEPARTMENT_OTHER)
Admission: EM | Admit: 2017-09-04 | Discharge: 2017-09-04 | Disposition: A | Discharge status: Home / Self Care | Payer: BLUE CROSS/BLUE SHIELD | Attending: Emergency Medicine | Admitting: Emergency Medicine

## 2017-09-04 ENCOUNTER — Emergency Department (HOSPITAL_BASED_OUTPATIENT_CLINIC_OR_DEPARTMENT_OTHER): Payer: BLUE CROSS/BLUE SHIELD

## 2017-09-04 ENCOUNTER — Other Ambulatory Visit: Payer: Self-pay

## 2017-09-04 ENCOUNTER — Encounter (HOSPITAL_BASED_OUTPATIENT_CLINIC_OR_DEPARTMENT_OTHER): Payer: Self-pay | Admitting: Emergency Medicine

## 2017-09-04 DIAGNOSIS — Z87891 Personal history of nicotine dependence: Secondary | ICD-10-CM | POA: Diagnosis not present

## 2017-09-04 DIAGNOSIS — M25511 Pain in right shoulder: Secondary | ICD-10-CM | POA: Diagnosis not present

## 2017-09-04 DIAGNOSIS — J45909 Unspecified asthma, uncomplicated: Secondary | ICD-10-CM | POA: Diagnosis not present

## 2017-09-04 NOTE — ED Triage Notes (Signed)
Patient states that she was at work and doing her job which is a repetitive motion to her right shoulder about a week ago. She reports that her right shoulder still hurt

## 2017-09-04 NOTE — Discharge Instructions (Signed)
Please take Ibuprofen (Advil, motrin) and Tylenol (acetaminophen) to relieve your pain.  You may take up to 600 MG (3 pills) of normal strength ibuprofen every 8 hours as needed.  In between doses of ibuprofen you make take tylenol, up to 1,000 mg (two extra strength pills).  Do not take more than 3,000 mg tylenol in a 24 hour period.  Please check all medication labels as many medications such as pain and cold medications may contain tylenol.  Do not drink alcohol while taking these medications.  Do not take other NSAID'S while taking ibuprofen (such as aleve or naproxen).  Please take ibuprofen with food to decrease stomach upset.  Please make sure you are using stretching and gentle range of motion.

## 2017-09-04 NOTE — ED Provider Notes (Signed)
[facility] EMERGENCY DEPARTMENT Provider Note   CSN: [id number] Arrival date & time: 09/04/17  1330     History   Chief Complaint Chief Complaint  Patient presents with  . Shoulder Pain    HPI Stephanie Adams is a 57 y.o. female who presents today for evaluation of right shoulder pain.  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  She reports that the past week or so she has noticed that movement is painful.  Since this morning when she perform the action she has had worsening pain in her right shoulder.  She denies any numbness or tingling.  She does not have a primary care doctor.  She has not been ill recently, no fevers or chills.  She has not tried any heat, cold, ibuprofen or tylenol for her pains.   HPI  Past Medical History:  Diagnosis Date  . Asthma   . Cervical disc disorder with radiculopathy of cervical region     Patient Active Problem List   Diagnosis Date Noted  . Cervical disc disorder with radiculopathy of cervical region 11/17/2015    Past Surgical History:  Procedure Laterality Date  . ABDOMINAL HYSTERECTOMY    . APPENDECTOMY    . CESAREAN SECTION      OB History    No data available       Home Medications    Prior to Admission medications   Medication Sig Start Date End Date Taking? Authorizing Provider  albuterol (PROVENTIL HFA;VENTOLIN HFA) 108 (90 Base) MCG/ACT inhaler Inhale into the lungs.    [provider]  HYDROcodone-acetaminophen (NORCO) 5-325 MG tablet Take 1 tablet by mouth every 4 (four) hours as needed for severe pain. 09/01/16   Hudnall, Azucena FallenShane R, MD  ibuprofen (ADVIL,MOTRIN) 400 MG tablet Take 400 mg by mouth every 6 (six) hours as needed.    [provider]  methocarbamol (ROBAXIN) 500 MG tablet Take 1 tablet (500 mg total) by mouth every 8 (eight) hours as needed. 09/01/16   Hudnall, Azucena FallenShane R, MD  predniSONE (DELTASONE) 10 MG tablet 6 tabs po  day 1, 5 tabs po day 2, 4 tabs po day 3, 3 tabs po day 4, 2 tabs po day 5, 1 tab po day 6 08/29/16   Leaphart, Lynann BeaverKenneth T, PA-C    Family History History reviewed. No pertinent family history.  Social History Social History   Tobacco Use  . Smoking status: Former Games developermoker  . Smokeless tobacco: Never Used  Substance Use Topics  . Alcohol use: No    Alcohol/week: 0.0 oz  . Drug use: No     Allergies   Penicillins   Review of Systems Review of Systems  Constitutional: Negative for chills and fever.  Musculoskeletal: Negative for neck pain and neck stiffness.       Right shoulder pain  Neurological: Negative for weakness and numbness.     Physical Exam Updated Vital Signs BP 118/72 (BP Location: Left Arm)   Pulse 71   Temp 98 F (36.7 C) (Oral)   Resp 16   Ht 4\' 11"  (1.499 m)   Wt 59 kg (130 lb)   SpO2 100%   BMI 26.26 kg/m   Physical Exam  Constitutional: She is oriented to person, place, and time. She appears well-developed and well-nourished.  HENT:  Head: Normocephalic and atraumatic.  Neck: Normal range of motion. Neck supple. No tracheal  deviation present.  Cardiovascular: Intact distal pulses.  2+ radial pulses bilaterally.  Musculoskeletal:  RUE: There is mild tenderness to palpation along posterior aspect of right shoulder and along the biceps tendon.  Patient's over these areas both re-created and exacerbated her pain.  She has limited arm abduction secondary to pain.  No crepitus, deformities, ecchymosis, or edema./5 grip strength bilaterally.  Neurological: She is alert and oriented to person, place, and time.  Station intact to right upper extremity.  Skin: Skin is warm and dry. She is not diaphoretic.  Nursing note and vitals reviewed.    ED Treatments / Results  Labs (all labs ordered are listed, but only abnormal results are displayed) Labs Reviewed - No data to display  EKG  EKG Interpretation None       Radiology Dg Shoulder  Right  Result Date: 09/04/2017 CLINICAL DATA:  Right shoulder pain radiating to the elbow after working for 1 week as a cook. EXAM: RIGHT SHOULDER - 2+ VIEW COMPARISON:  None. FINDINGS: There is no evidence of fracture or dislocation. AC and glenohumeral joints are intact. There is no evidence of arthropathy or other focal bone abnormality. Soft tissues are unremarkable. IMPRESSION: No acute osseous abnormality. Electronically Signed   By: Tollie Eth M.D.   On: 09/04/2017 14:08    Procedures Procedures (including critical care time)  Medications Ordered in ED Medications - No data to display   Initial Impression / Assessment and Plan / ED Course  I have reviewed the triage vital signs and the nursing notes.  Pertinent labs & imaging results that were available during my care of the patient were reviewed by me and considered in my medical decision making (see chart for details).    Presents today for evaluation of acute pain in her right shoulder.  X-rays are obtained without acute abnormalities.  Started working as a Financial risk analyst and since then she has had right shoulder pain which she attributes to opening multiple large containers.  She denies fevers or chills.  There is limited range of motion secondary to pain.  I am not concerned for septic arthritis as shoulder is not warm, red, and patient is well appearing.  I suspect over use injury causing her symptoms.  She was instructed on basic conservative care measures, instructed to make sure she is using the shoulder to avoid developing frozen shoulder.  She is given Alice Rieger O follow-up.  She was given return precautions, states her understanding.  OTC pain medicine PRN.    Final Clinical Impressions(s) / ED Diagnoses   Final diagnoses:  Acute pain of right shoulder    ED Discharge Orders    None       Norman Clay 09/04/17 1603    Little, Ambrose Finland, MD 09/05/17 [phone]

## 2017-09-04 NOTE — ED Notes (Signed)
Patient transported to X-ray 

## 2017-10-16 ENCOUNTER — Other Ambulatory Visit: Payer: Self-pay

## 2017-10-16 ENCOUNTER — Encounter (HOSPITAL_BASED_OUTPATIENT_CLINIC_OR_DEPARTMENT_OTHER): Payer: Self-pay | Admitting: *Deleted

## 2017-10-16 ENCOUNTER — Emergency Department (HOSPITAL_BASED_OUTPATIENT_CLINIC_OR_DEPARTMENT_OTHER): Payer: BLUE CROSS/BLUE SHIELD

## 2017-10-16 ENCOUNTER — Emergency Department (HOSPITAL_BASED_OUTPATIENT_CLINIC_OR_DEPARTMENT_OTHER)
Admission: EM | Admit: 2017-10-16 | Discharge: 2017-10-16 | Disposition: A | Discharge status: Home / Self Care | Payer: BLUE CROSS/BLUE SHIELD | Attending: Emergency Medicine | Admitting: Emergency Medicine

## 2017-10-16 DIAGNOSIS — Z79899 Other long term (current) drug therapy: Secondary | ICD-10-CM | POA: Insufficient documentation

## 2017-10-16 DIAGNOSIS — Z87891 Personal history of nicotine dependence: Secondary | ICD-10-CM | POA: Diagnosis not present

## 2017-10-16 DIAGNOSIS — J069 Acute upper respiratory infection, unspecified: Secondary | ICD-10-CM | POA: Diagnosis not present

## 2017-10-16 DIAGNOSIS — J45909 Unspecified asthma, uncomplicated: Secondary | ICD-10-CM | POA: Diagnosis not present

## 2017-10-16 DIAGNOSIS — B9789 Other viral agents as the cause of diseases classified elsewhere: Secondary | ICD-10-CM

## 2017-10-16 DIAGNOSIS — R05 Cough: Secondary | ICD-10-CM | POA: Diagnosis present

## 2017-10-16 MED ORDER — KETOROLAC TROMETHAMINE 30 MG/ML IJ SOLN
30.0000 mg | Freq: Once | INTRAMUSCULAR | Status: AC
  Administered 2017-10-16: 30 mg via INTRAMUSCULAR
  Filled 2017-10-16: qty 1

## 2017-10-16 MED ORDER — BENZONATATE 100 MG PO CAPS: 200.0000 mg | ORAL_CAPSULE | Freq: Three times a day (TID) | ORAL | 0 refills | Status: AC

## 2017-10-16 MED ORDER — BENZONATATE 100 MG PO CAPS
200.0000 mg | ORAL_CAPSULE | Freq: Once | ORAL | Status: AC
  Administered 2017-10-16: 200 mg via ORAL
  Filled 2017-10-16: qty 2

## 2017-10-16 MED ORDER — GUAIFENESIN-CODEINE 100-10 MG/5ML PO SOLN: 5.0000 mL | Freq: Three times a day (TID) | ORAL | 0 refills | Status: AC | PRN

## 2017-10-16 NOTE — ED Provider Notes (Signed)
[facility] EMERGENCY DEPARTMENT Provider Note   CSN: [id number] Arrival date & time: 10/16/17  2000     History   Chief Complaint Chief Complaint  Patient presents with  . Cough    HPI Stephanie Adams is a 57 y.o. female with a past medical history of asthma, who presents to ED for evaluation of 2-day history of dry cough.  She also reports rhinorrhea, nasal congestion.  She is taking over-the-counter anti-Robitussin with mild improvement in her symptoms.  Denies any chest pain, shortness of breath, hemoptysis, fever, sick contacts with similar symptoms.  She does state that her entire body hurts due to her constant coughing.  HPI  Past Medical History:  Diagnosis Date  . Asthma   . Cervical disc disorder with radiculopathy of cervical region     Patient Active Problem List   Diagnosis Date Noted  . Cervical disc disorder with radiculopathy of cervical region 11/17/2015    Past Surgical History:  Procedure Laterality Date  . ABDOMINAL HYSTERECTOMY    . APPENDECTOMY    . CESAREAN SECTION       OB History   None      Home Medications    Prior to Admission medications   Medication Sig Start Date End Date Taking? Authorizing Provider  albuterol (PROVENTIL HFA;VENTOLIN HFA) 108 (90 Base) MCG/ACT inhaler Inhale into the lungs.    [provider]  benzonatate (TESSALON) 100 MG capsule Take 2 capsules (200 mg total) by mouth every 8 (eight) hours. 10/16/17   Domanique Huesman, PA-C  guaiFENesin-codeine 100-10 MG/5ML syrup Take 5 mLs by mouth 3 (three) times daily as needed for cough. 10/16/17   Saman Umstead, PA-C  HYDROcodone-acetaminophen (NORCO) 5-325 MG tablet Take 1 tablet by mouth every 4 (four) hours as needed for severe pain. 09/01/16   Hudnall, Azucena Fallen, MD  ibuprofen (ADVIL,MOTRIN) 400 MG tablet Take 400 mg by mouth every 6 (six) hours as needed.    [provider]  methocarbamol (ROBAXIN) 500 MG tablet Take 1 tablet (500 mg total) by mouth every 8  (eight) hours as needed. 09/01/16   Hudnall, Azucena Fallen, MD  predniSONE (DELTASONE) 10 MG tablet 6 tabs po day 1, 5 tabs po day 2, 4 tabs po day 3, 3 tabs po day 4, 2 tabs po day 5, 1 tab po day 6 08/29/16   Leaphart, Lynann Beaver, PA-C    Family History No family history on file.  Social History Social History   Tobacco Use  . Smoking status: Former Games developer  . Smokeless tobacco: Never Used  Substance Use Topics  . Alcohol use: No    Alcohol/week: 0.0 oz  . Drug use: No     Allergies   Penicillins   Review of Systems Review of Systems  Constitutional: Negative for appetite change, chills and fever.  HENT: Negative for ear pain, rhinorrhea, sneezing and sore throat.   Eyes: Negative for photophobia and visual disturbance.  Respiratory: Positive for cough. Negative for chest tightness, shortness of breath and wheezing.   Cardiovascular: Negative for chest pain and palpitations.  Gastrointestinal: Negative for abdominal pain, blood in stool, constipation, diarrhea, nausea and vomiting.  Genitourinary: Negative for dysuria, hematuria and urgency.  Musculoskeletal: Negative for myalgias.  Skin: Negative for rash.  Neurological: Negative for dizziness, weakness and light-headedness.     Physical Exam Updated Vital Signs BP 120/71 (BP Location: Right Arm)   Pulse 74   Temp 98.2 F (36.8 C) (Oral)   Resp  20   Ht 4\' 11"  (1.499 m)   Wt 59 kg (130 lb)   SpO2 99%   BMI 26.26 kg/m   Physical Exam  Constitutional: She appears well-developed and well-nourished. No distress.  Nontoxic appearing and in no acute distress. Cough noted on examination.  HENT:  Head: Normocephalic and atraumatic.  Nose: Nose normal.  Eyes: Conjunctivae and EOM are normal. Right eye exhibits no discharge. Left eye exhibits no discharge. No scleral icterus.  Neck: Normal range of motion. Neck supple.  Cardiovascular: Normal rate, regular rhythm, normal heart sounds and intact distal pulses. Exam reveals no  gallop and no friction rub.  No murmur heard. Pulmonary/Chest: Effort normal and breath sounds normal. No respiratory distress.  Abdominal: Soft. Bowel sounds are normal. She exhibits no distension. There is no tenderness. There is no guarding.  Musculoskeletal: Normal range of motion. She exhibits no edema.  Neurological: She is alert. She exhibits normal muscle tone. Coordination normal.  Skin: Skin is warm and dry. No rash noted.  Psychiatric: She has a normal mood and affect.  Nursing note and vitals reviewed.    ED Treatments / Results  Labs (all labs ordered are listed, but only abnormal results are displayed) Labs Reviewed - No data to display  EKG None  Radiology Dg Chest 2 View  Result Date: 10/16/2017 CLINICAL DATA:  Cough. EXAM: CHEST - 2 VIEW COMPARISON:  July 17, 2015 FINDINGS: The heart size and mediastinal contours are within normal limits. Both lungs are clear. The visualized skeletal structures are unremarkable. IMPRESSION: No active cardiopulmonary disease. Electronically Signed   By: Gerome Sam III M.D   On: 10/16/2017 20:45    Procedures Procedures (including critical care time)  Medications Ordered in ED Medications  ketorolac (TORADOL) 30 MG/ML injection 30 mg (30 mg Intramuscular Given 10/16/17 2254)  benzonatate (TESSALON) capsule 200 mg (200 mg Oral Given 10/16/17 2254)     Initial Impression / Assessment and Plan / ED Course  I have reviewed the triage vital signs and the nursing notes.  Pertinent labs & imaging results that were available during my care of the patient were reviewed by me and considered in my medical decision making (see chart for details).     Patient presents to ED for evaluation of 2 day history of cough. She also reports nasal congestion, rhinorrhea.  She denies any chest pain, hemoptysis, shortness of breath.  She does report total body myalgias secondary to her constant cough.  She denies any sick contacts with similar  symptoms.  On physical exam she is overall well-appearing.  Her lungs are clear to auscultation bilaterally.  She does have a hacking cough noted on my examination.  Chest x-ray was unremarkable.  Suspect that her symptoms are viral in nature.  Will give anti-inflammatories, antitussives and advised her to follow-up with PCP for further evaluation if symptoms persist.  I doubt pneumonia, PE, as she is not tachycardic, tachypneic or hypoxic.  Advised to return for any severe or worsening symptoms.  Portions of this note were generated with Scientist, clinical (histocompatibility and immunogenetics). Dictation errors may occur despite best attempts at proofreading.   Final Clinical Impressions(s) / ED Diagnoses   Final diagnoses:  Viral URI with cough    ED Discharge Orders        Ordered    benzonatate (TESSALON) 100 MG capsule  Every 8 hours     10/16/17 2321    guaiFENesin-codeine 100-10 MG/5ML syrup  3 times daily PRN  10/16/17 2321       Dietrich PatesKhatri, Iya Hamed, PA-C 10/16/17 2324    Tegeler, Canary Brimhristopher J, MD 10/16/17 [phone]

## 2017-10-16 NOTE — ED Triage Notes (Signed)
Cough since yesterday. Coughing so hard she vomits and it makes her head and chest hurt.

## 2017-10-16 NOTE — ED Notes (Signed)
She cannot stop coughing. States her head hurts.

## 2020-02-15 IMAGING — DX DG SHOULDER 2+V*R*
3 series · 3 of 3 positions shown · non-contrast
Comparison: None.

CLINICAL DATA: Right shoulder pain radiating to the elbow after
working for 1 week as [employer].

EXAM:
RIGHT SHOULDER - 2+ VIEW

[shoulder grashey]
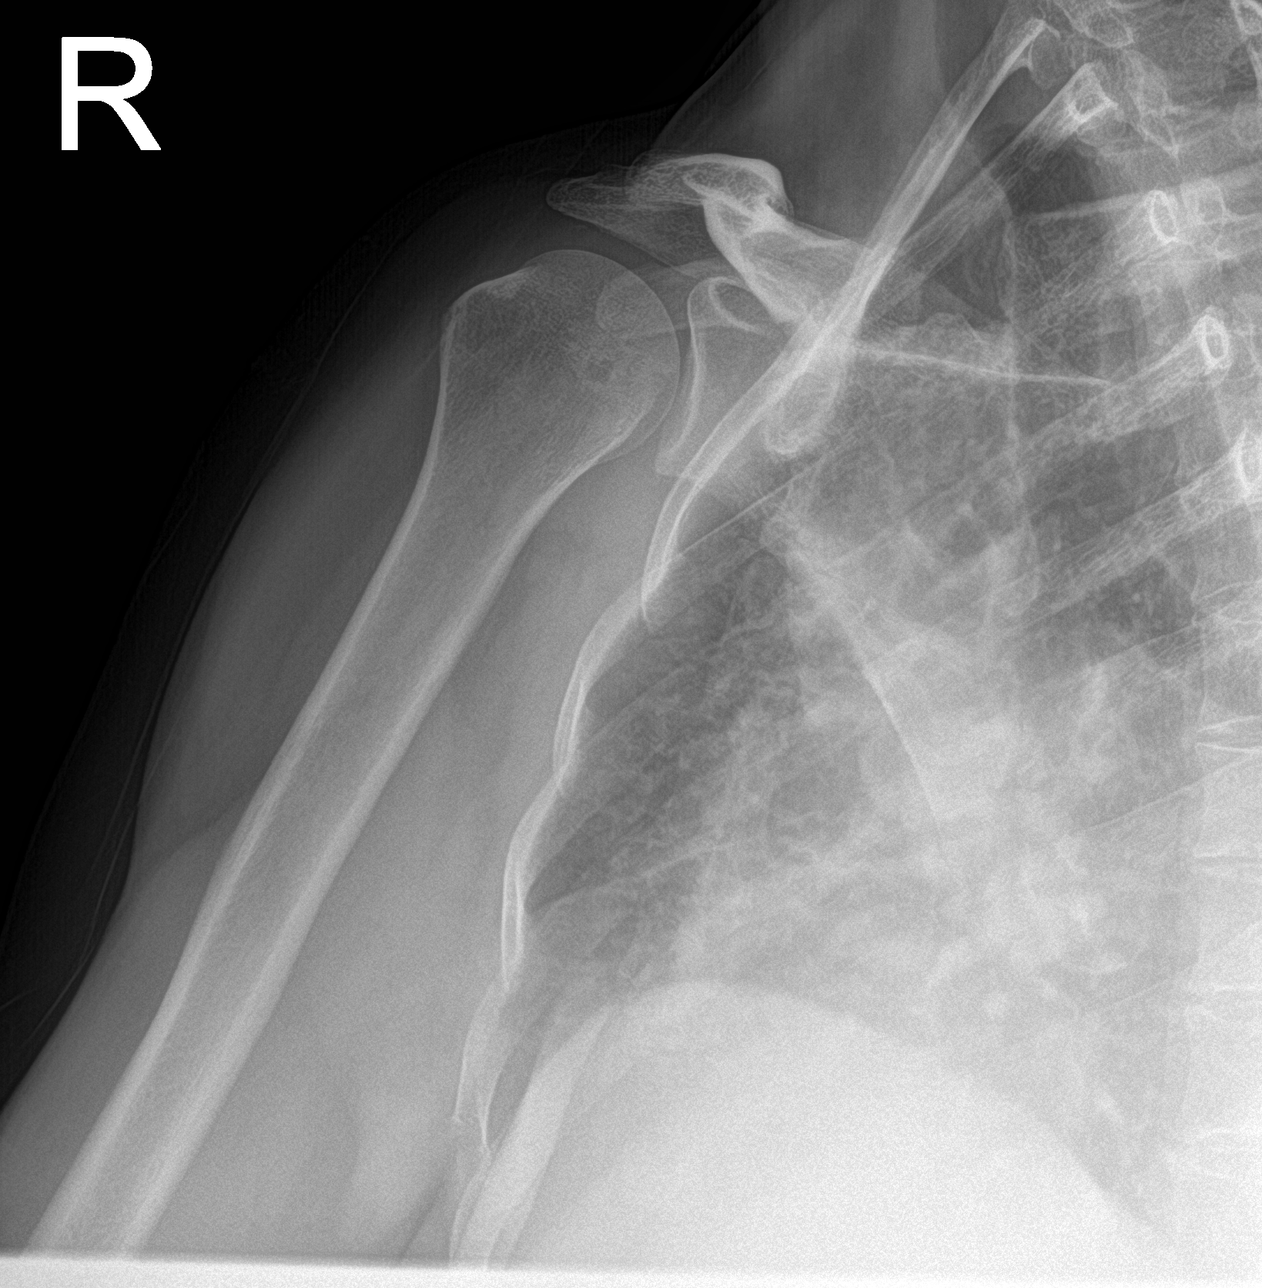

[shoulder y view]
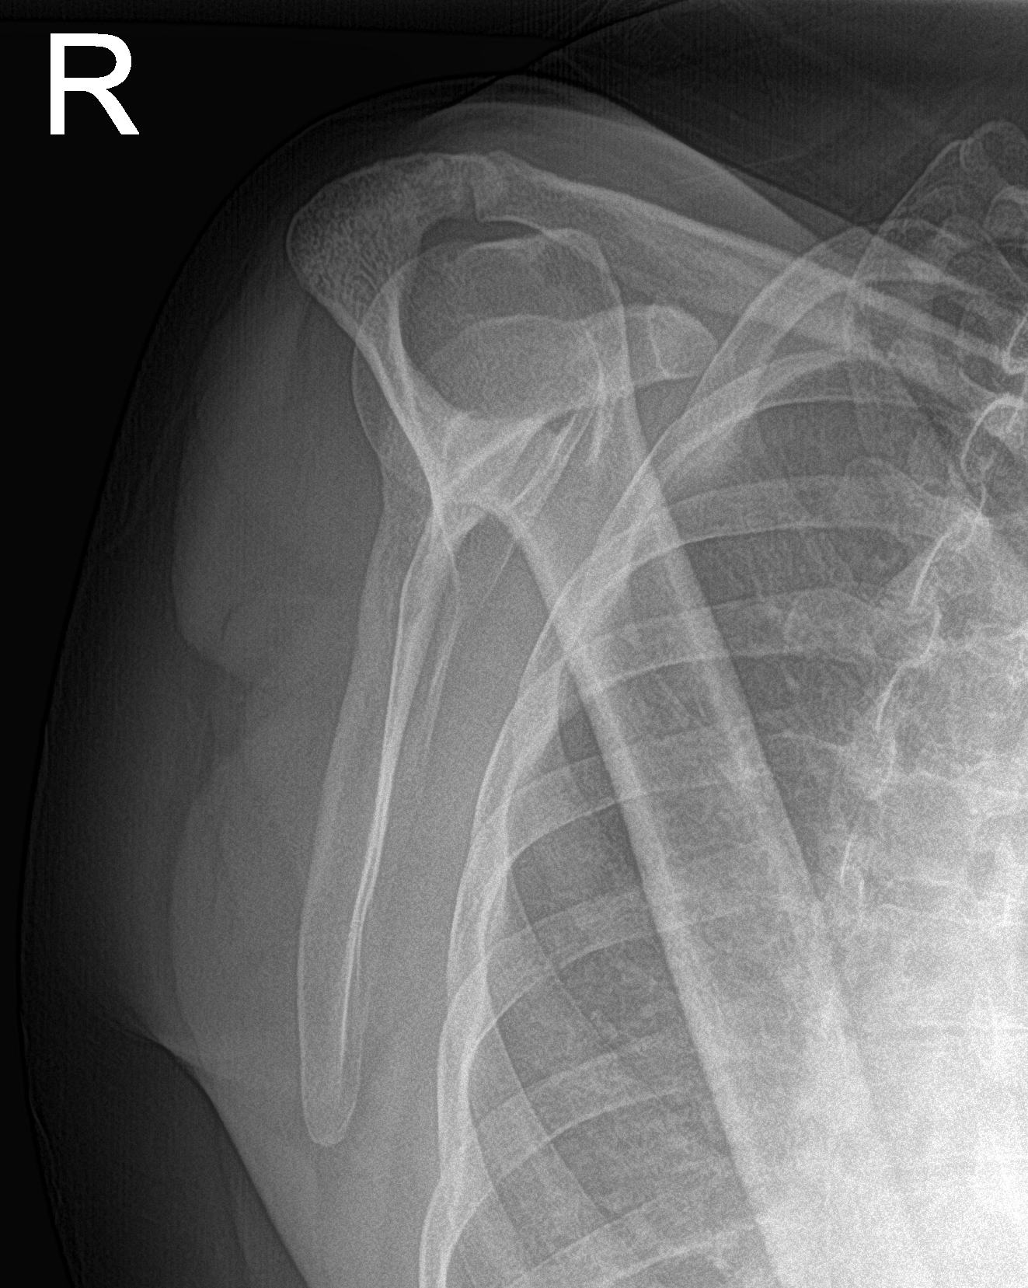

[shoulder axillary]
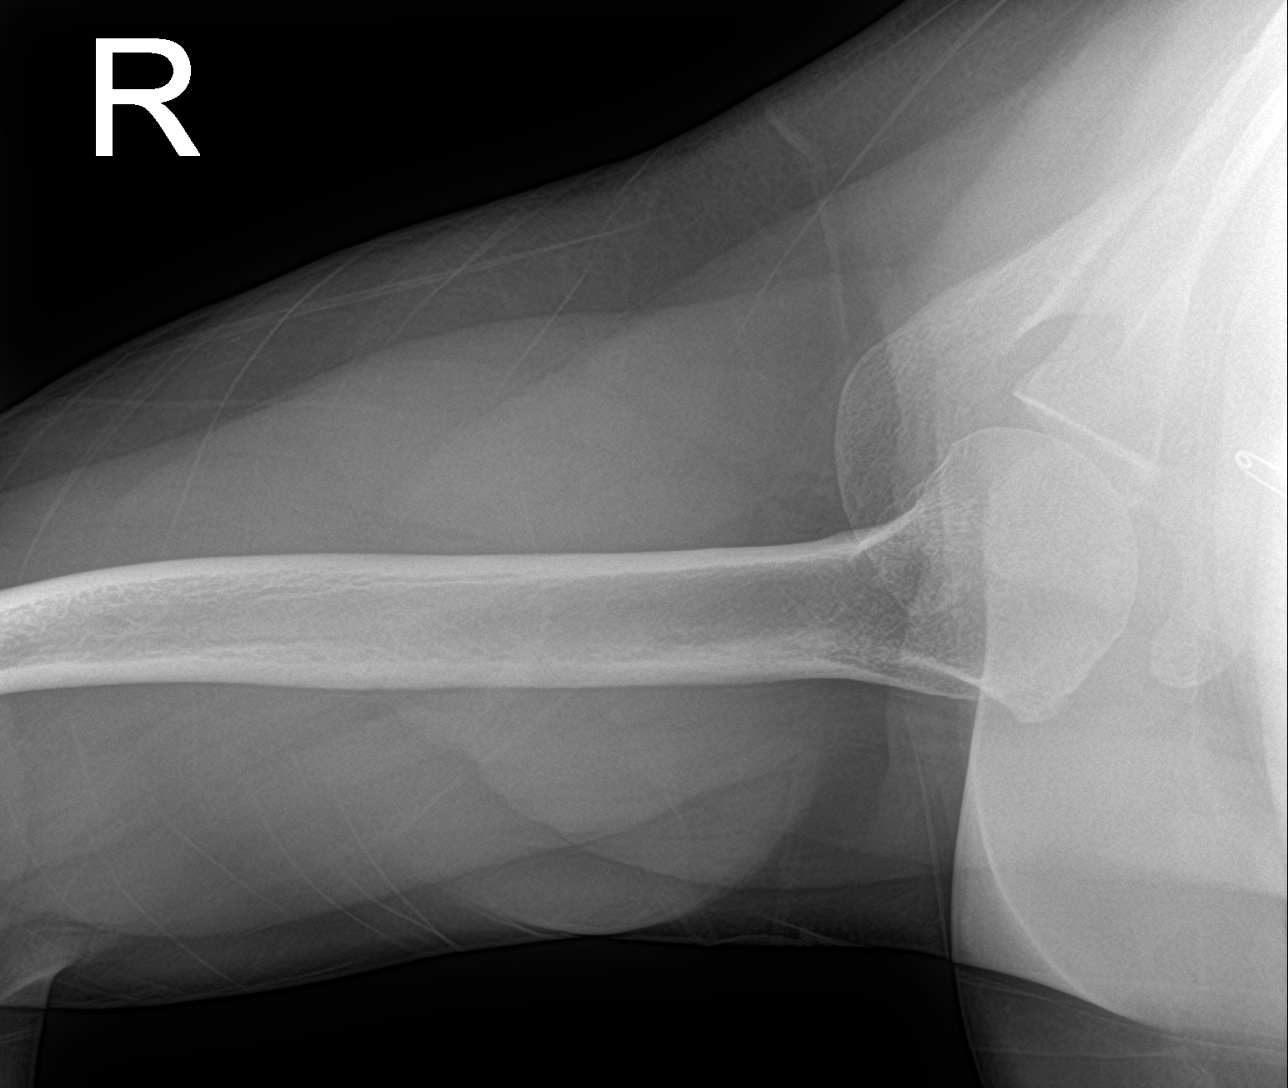

[3 of 3 positions shown; findings below may reference images not displayed]

FINDINGS: There is no evidence of fracture or dislocation. AC and glenohumeral
joints are intact. There is no evidence of arthropathy or other
focal bone abnormality. Soft tissues are unremarkable.
IMPRESSION: No acute osseous abnormality.

## 2021-10-25 ENCOUNTER — Emergency Department (HOSPITAL_BASED_OUTPATIENT_CLINIC_OR_DEPARTMENT_OTHER)
Admission: EM | Admit: 2021-10-25 | Discharge: 2021-10-25 | Disposition: A | Discharge status: Home / Self Care | Payer: BLUE CROSS/BLUE SHIELD | Attending: Emergency Medicine | Admitting: Emergency Medicine

## 2021-10-25 ENCOUNTER — Other Ambulatory Visit: Payer: Self-pay

## 2021-10-25 ENCOUNTER — Encounter (HOSPITAL_BASED_OUTPATIENT_CLINIC_OR_DEPARTMENT_OTHER): Payer: Self-pay

## 2021-10-25 ENCOUNTER — Emergency Department (HOSPITAL_BASED_OUTPATIENT_CLINIC_OR_DEPARTMENT_OTHER): Payer: BLUE CROSS/BLUE SHIELD

## 2021-10-25 DIAGNOSIS — R0602 Shortness of breath: Secondary | ICD-10-CM | POA: Diagnosis not present

## 2021-10-25 DIAGNOSIS — Z7951 Long term (current) use of inhaled steroids: Secondary | ICD-10-CM | POA: Diagnosis not present

## 2021-10-25 DIAGNOSIS — R0789 Other chest pain: Secondary | ICD-10-CM | POA: Diagnosis not present

## 2021-10-25 DIAGNOSIS — J45909 Unspecified asthma, uncomplicated: Secondary | ICD-10-CM | POA: Insufficient documentation

## 2021-10-25 DIAGNOSIS — R072 Precordial pain: Secondary | ICD-10-CM | POA: Diagnosis present

## 2021-10-25 LAB — BASIC METABOLIC PANEL
Anion gap: 6 (ref 5–15)
BUN: 13 mg/dL (ref 8–23)
CO2: 27 mmol/L (ref 22–32)
Calcium: 9.8 mg/dL (ref 8.9–10.3)
Chloride: 108 mmol/L (ref 98–111)
Creatinine, Ser: 0.8 mg/dL (ref 0.44–1.00)
GFR, Estimated: >60 mL/min (ref >60)
Glucose, Bld: 92 mg/dL (ref 70–99)
Potassium: 4 mmol/L (ref 3.5–5.1)
Sodium: 141 mmol/L (ref 135–145)

## 2021-10-25 LAB — CBC
HCT: 44 % (ref 36.0–46.0)
Hemoglobin: 13.1 g/dL (ref 12.0–15.0)
MCH: 22.9 pg — ABNORMAL LOW (ref 26.0–34.0)
MCHC: 29.8 g/dL — ABNORMAL LOW (ref 30.0–36.0)
MCV: 76.8 fL — ABNORMAL LOW (ref 80.0–100.0)
Platelets: 375 10*3/uL (ref 150–400)
RBC: 5.73 MIL/uL — ABNORMAL HIGH (ref 3.87–5.11)
RDW: 14.8 % (ref 11.5–15.5)
WBC: 8.9 10*3/uL (ref 4.0–10.5)
nRBC: 0 % (ref 0.0–0.2)

## 2021-10-25 LAB — TROPONIN I (HIGH SENSITIVITY): Troponin I (High Sensitivity): 3 ng/L (ref <18)

## 2021-10-25 MED ORDER — HYDROCODONE-ACETAMINOPHEN 5-325 MG PO TABS
1.0000 | ORAL_TABLET | Freq: Once | ORAL | Status: DC
  Filled 2021-10-25: qty 1

## 2021-10-25 NOTE — ED Triage Notes (Signed)
Began taking statin one month ago ?

## 2021-10-25 NOTE — Discharge Instructions (Signed)
You were diagnosed today with pain of the chest wall.  There were no signs of heart involvement and lab work earlier EKG.  You may use ibuprofen or Tylenol as needed for pain.  If you develop worsening chest pain, shortness of breath, or other life threats, please return to the emergency department for further evaluation ?

## 2021-10-25 NOTE — ED Provider Notes (Signed)
?[facility] EMERGENCY DEPARTMENT ?Provider Note ? ? ?CSN: [id number] ?Arrival date & time: 10/25/21  1408 ? ?  ? ?History ? ?Chief Complaint  ?Patient presents with  ? Chest Pain  ? ? ?Stephanie Adams is a 61 y.o. female.  Patient presents to the hospital complaining of 1 day of chest pain and shortness of breath.  Patient states that yesterday while at work she began to have sharp pains in the center of her chest.  These pains were worse with movement.  She also had a hard time catching her breath at the time.  She went home from work and continued to have pain throughout the night.  She states that the pain is sharp and substernal in nature.  Rates the pain a 6 out of 10.  Patient does endorse bilateral arm pain over the past few days the seem to begin in her neck.  The shortness of breath goes along with sharp pains.  The patient endorses tenderness to palpation of her chest.  Denies abdominal pain, nausea, vomiting.  Past medical history significant for asthma, cervical disc disorder with radiculopathy, appendectomy, hysterectomy ? ?HPI ? ?  ? ?Home Medications ?Prior to Admission medications   ?Medication Sig Start Date End Date Taking? Authorizing Provider  ?albuterol (PROVENTIL HFA;VENTOLIN HFA) 108 (90 Base) MCG/ACT inhaler Inhale into the lungs.    [provider]  ?benzonatate (TESSALON) 100 MG capsule Take 2 capsules (200 mg total) by mouth every 8 (eight) hours. 10/16/17   Khatri, Hina, PA-C  ?guaiFENesin-codeine 100-10 MG/5ML syrup Take 5 mLs by mouth 3 (three) times daily as needed for cough. 10/16/17   Khatri, Hina, PA-C  ?HYDROcodone-acetaminophen (NORCO) 5-325 MG tablet Take 1 tablet by mouth every 4 (four) hours as needed for severe pain. 09/01/16   Lenda KelpHudnall, Shane R, MD  ?ibuprofen (ADVIL,MOTRIN) 400 MG tablet Take 400 mg by mouth every 6 (six) hours as needed.    [provider]  ?methocarbamol (ROBAXIN) 500 MG tablet Take 1 tablet (500 mg total) by mouth every 8 (eight) hours as  needed. 09/01/16   Hudnall, Azucena FallenShane R, MD  ?predniSONE (DELTASONE) 10 MG tablet 6 tabs po day 1, 5 tabs po day 2, 4 tabs po day 3, 3 tabs po day 4, 2 tabs po day 5, 1 tab po day 6 08/29/16   Rise MuLeaphart, Kenneth T, PA-C  ?   ? ?Allergies    ?Penicillins   ? ?Review of Systems   ?Review of Systems  ?Constitutional:  Negative for fever.  ?Respiratory:  Positive for shortness of breath. Negative for cough.   ?Cardiovascular:  Positive for chest pain.  ?Gastrointestinal:  Negative for abdominal pain, nausea and vomiting.  ?Musculoskeletal:  Positive for neck pain.  ? ?Physical Exam ?Updated Vital Signs ?BP (!) 165/83   Pulse 62   Temp 98 ?F (36.7 ?C) (Oral)   Resp 19   SpO2 94%  ?Physical Exam ?Vitals and nursing note reviewed.  ?Constitutional:   ?   General: She is not in acute distress. ?HENT:  ?   Head: Normocephalic and atraumatic.  ?Cardiovascular:  ?   Rate and Rhythm: Normal rate and regular rhythm.  ?   Heart sounds: Normal heart sounds.  ?Pulmonary:  ?   Effort: Pulmonary effort is normal. No respiratory distress.  ?   Breath sounds: Normal breath sounds.  ?Abdominal:  ?   Palpations: Abdomen is soft.  ?Musculoskeletal:     ?   General: Normal range of  motion.  ?   Cervical back: Normal range of motion and neck supple.  ?Skin: ?   General: Skin is warm and dry.  ?Neurological:  ?   Mental Status: She is alert.  ? ? ?ED Results / Procedures / Treatments   ?Labs ?(all labs ordered are listed, but only abnormal results are displayed) ?Labs Reviewed  ?CBC - Abnormal; Notable for the following components:  ?    Result Value  ? RBC 5.73 (*)   ? MCV 76.8 (*)   ? MCH 22.9 (*)   ? MCHC 29.8 (*)   ? All other components within normal limits  ?BASIC METABOLIC PANEL  ?TROPONIN I (HIGH SENSITIVITY)  ? ? ?EKG ?EKG Interpretation ? ?Date/Time:  Tuesday Oct 25 2021 14:21:21 EDT ?Ventricular Rate:  63 ?PR Interval:  153 ?QRS Duration: 84 ?QT Interval:  390 ?QTC Calculation: 400 ?R Axis:   80 ?Text Interpretation: Sinus rhythm No  significant change since prior 5/17 Confirmed by Meridee Score [phone]) on 10/25/2021 2:26:52 PM ? ?Radiology ?DG Chest Port 1 View ? ?Result Date: 10/25/2021 ?CLINICAL DATA:  Chest pain. EXAM: PORTABLE CHEST 1 VIEW COMPARISON:  Chest two views 10/16/2017 FINDINGS: Cardiac silhouette and mediastinal contours are within normal limits. Mild calcification within aortic arch. The lungs are clear. No pleural effusion or pneumothorax. No acute skeletal abnormality. IMPRESSION: No active disease. Electronically Signed   By: Neita Garnet M.D.   On: 10/25/2021 14:56   ? ?Procedures ?Procedures  ? ? ?Medications Ordered in ED ?Medications  ?HYDROcodone-acetaminophen (NORCO/VICODIN) 5-325 MG per tablet 1 tablet (1 tablet Oral Patient Refused/Not Given 10/25/21 1502)  ? ? ?ED Course/ Medical Decision Making/ A&P ?  ?                        ?Medical Decision Making ?Amount and/or Complexity of Data Reviewed ?Labs: ordered. ?Radiology: ordered. ? ?Risk ?Prescription drug management. ? ? ?This patient presents to the ED for concern of chest pain, this involves an extensive number of treatment options, and is a complaint that carries with it a high risk of complications and morbidity.  The differential diagnosis includes ACS, pneumonia, PE, musculoskeletal pain, and others ? ? ?Co morbidities that complicate the patient evaluation ? ?Cervical disc disorder with radiculopathy of cervical region ? ? ?Additional history obtained: ? ? ?External records from outside source obtained and reviewed including care everywhere document showing routine physical with medical problems from September 21, 2021 ? ? ?Lab Tests: ? ?I Ordered, and personally interpreted labs.  The pertinent results include: Troponin of 3 ? ? ?Imaging Studies ordered: ? ?I ordered imaging studies including chest x-ray ?I independently visualized and interpreted imaging which showed no active disease ?I agree with the radiologist interpretation ? ? ?Cardiac Monitoring: /  EKG: ? ?The patient was maintained on a cardiac monitor.  I personally viewed and interpreted the cardiac monitored which showed an underlying rhythm of: Sinus rhythm ? ? ?Problem List / ED Course / Critical interventions / Medication management ? ? ?I ordered medication including hydrocodone for pain ?The patient's pain improved prior to medication administration and the medicine was not administered ?I have reviewed the patients home medicines and have made adjustments as needed ? ? ?Test / Admission - Considered: ? ?There are no ischemic changes noted on EKG.  Initial troponin of 3.  Heart score of 1.  No indication for further troponin at this time.  ACS very low on differential ? ?  No signs of pneumonia on chest x-ray ? ?The patient is not tachycardic and is not currently short of breath.  No reason to suspect PE at this time. ? ?The patient's pain is reproducible with movement and her chest is tender to palpation.  Her pain is likely musculoskeletal in nature.  The patient did mention arm pain earlier but in reviewing her history she has a history of cervical radiculopathy which would explain the bilateral arm symptoms.  I see no reason for further work-up in the emergent environment at this time.  The patient may discharge home.  At this time her pain has decreased. ? ?Final Clinical Impression(s) / ED Diagnoses ?Final diagnoses:  ?Chest wall pain  ? ? ?Rx / DC Orders ?ED Discharge Orders   ? ? None  ? ?  ? ? ?  ?Darrick Grinder, PA-C ?10/25/21 1644 ? ?  ?Terrilee Files, MD ?10/25/21 1734 ? ?

## 2021-10-25 NOTE — ED Triage Notes (Signed)
Pt states chest pain center of chest with some radiation to left arm.  Reports nausea, shortness of breath.  Hx high cholesterol ?
